# Patient Record
Sex: Male | Born: 1995 | Race: Black or African American | Hispanic: No | Marital: Single | Smoking: Never smoker
Health system: Southern US, Community
[De-identification: ages and names within clinical notes are randomized; demographics above are authoritative.]

---
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
## 2018-02-11 ENCOUNTER — Other Ambulatory Visit: Payer: Self-pay

## 2018-02-11 ENCOUNTER — Encounter (HOSPITAL_COMMUNITY): Payer: Self-pay | Admitting: Emergency Medicine

## 2018-02-11 ENCOUNTER — Emergency Department (HOSPITAL_COMMUNITY)
Admission: EM | Admit: 2018-02-11 | Discharge: 2018-02-11 | Disposition: A | Discharge status: Home / Self Care | Payer: Medicaid Other | Attending: Physician Assistant | Admitting: Physician Assistant

## 2018-02-11 DIAGNOSIS — R42 Dizziness and giddiness: Secondary | ICD-10-CM | POA: Diagnosis present

## 2018-02-11 DIAGNOSIS — G44209 Tension-type headache, unspecified, not intractable: Secondary | ICD-10-CM | POA: Diagnosis not present

## 2018-02-11 LAB — URINALYSIS, ROUTINE W REFLEX MICROSCOPIC
Bilirubin Urine: NEGATIVE
GLUCOSE, UA: NEGATIVE mg/dL
HGB URINE DIPSTICK: NEGATIVE
Ketones, ur: NEGATIVE mg/dL
Leukocytes, UA: NEGATIVE
Nitrite: NEGATIVE
PROTEIN: NEGATIVE mg/dL
Specific Gravity, Urine: 1.017 (ref 1.005–1.030)
pH: 6 (ref 5.0–8.0)

## 2018-02-11 LAB — BASIC METABOLIC PANEL
Anion gap: 8 (ref 5–15)
BUN: 20 mg/dL (ref 6–20)
CALCIUM: 9.4 mg/dL (ref 8.9–10.3)
CHLORIDE: 104 mmol/L (ref 101–111)
CO2: 23 mmol/L (ref 22–32)
CREATININE: 1.13 mg/dL (ref 0.61–1.24)
GFR calc non Af Amer: >60 mL/min (ref >60)
GLUCOSE: 85 mg/dL (ref 65–99)
Potassium: 3.9 mmol/L (ref 3.5–5.1)
Sodium: 135 mmol/L (ref 135–145)

## 2018-02-11 LAB — CBC
HCT: 42.7 % (ref 39.0–52.0)
Hemoglobin: 14.7 g/dL (ref 13.0–17.0)
MCH: 29.6 pg (ref 26.0–34.0)
MCHC: 34.4 g/dL (ref 30.0–36.0)
MCV: 85.9 fL (ref 78.0–100.0)
PLATELETS: 284 10*3/uL (ref 150–400)
RBC: 4.97 MIL/uL (ref 4.22–5.81)
RDW: 12.7 % (ref 11.5–15.5)
WBC: 3.9 10*3/uL — ABNORMAL LOW (ref 4.0–10.5)

## 2018-02-11 LAB — CBG MONITORING, ED: GLUCOSE-CAPILLARY: 82 mg/dL (ref 65–99)

## 2018-02-11 MED ORDER — KETOROLAC TROMETHAMINE 30 MG/ML IJ SOLN
30.0000 mg | Freq: Once | INTRAMUSCULAR | Status: AC
  Administered 2018-02-11: 30 mg via INTRAVENOUS
  Filled 2018-02-11: qty 1

## 2018-02-11 MED ORDER — ACETAMINOPHEN 325 MG PO TABS
650.0000 mg | ORAL_TABLET | Freq: Once | ORAL | Status: AC
  Administered 2018-02-11: 650 mg via ORAL
  Filled 2018-02-11: qty 2

## 2018-02-11 MED ORDER — SODIUM CHLORIDE 0.9 % IV BOLUS
1000.0000 mL | Freq: Once | INTRAVENOUS | Status: AC
  Administered 2018-02-11: 1000 mL via INTRAVENOUS

## 2018-02-11 NOTE — ED Triage Notes (Signed)
Pt complaint of lightheadedness, dry mouth, and tired onset yesterday.

## 2018-02-11 NOTE — ED Notes (Signed)
ED Provider at bedside. 

## 2018-02-11 NOTE — ED Provider Notes (Signed)
[facility]-EMERGENCY DEPT Provider Note   CSN: [id number] Arrival date & time: 02/11/18  1015     History   Chief Complaint Chief Complaint  Patient presents with  . Dizziness    HPI [facility] is a 22 y.o. male who presents to ED for evaluation of lightheadedness, dry mouth, polyuria, headache since yesterday.  Symptoms began when patient was at work.  He has not tried any medications prior to arrival to help with his symptoms.  He states that since arrival in the ED, his headache, lightheadedness has improved.  Denies any head injuries or falls, numbness in arms or legs, vision changes, vomiting, personal history of diabetes, neck pain, fevers, family history of aneurysms.  HPI  History reviewed. No pertinent past medical history.  There are no active problems to display for this patient.   History reviewed. No pertinent surgical history.      Home Medications    Prior to Admission medications   Not on File    Family History No family history on file.  Social History Social History   Tobacco Use  . Smoking status: Not on file  Substance Use Topics  . Alcohol use: Not on file  . Drug use: Not on file     Allergies   Patient has no known allergies.   Review of Systems Review of Systems  Constitutional: Negative for appetite change, chills and fever.  HENT: Negative for ear pain, rhinorrhea, sneezing and sore throat.   Eyes: Negative for photophobia and visual disturbance.  Respiratory: Negative for cough, chest tightness, shortness of breath and wheezing.   Cardiovascular: Negative for chest pain and palpitations.  Gastrointestinal: Negative for abdominal pain, blood in stool, constipation, diarrhea, nausea and vomiting.  Endocrine: Positive for polyuria.  Genitourinary: Negative for dysuria, hematuria and urgency.  Musculoskeletal: Negative for myalgias.  Skin: Negative for rash.  Neurological: Positive for  light-headedness and headaches. Negative for dizziness and weakness.     Physical Exam Updated Vital Signs BP (!) 141/89   Pulse (!) 55   Temp 98.1 F (36.7 C) (Oral)   Resp 13   Ht  (1.88 m)   Wt 77.1 kg (170 lb)   SpO2 100%   BMI 21.83 kg/m   Physical Exam  Constitutional: He is oriented to person, place, and time. He appears well-developed and well-nourished. No distress.  Nontoxic-appearing and in no acute distress.  HENT:  Head: Normocephalic and atraumatic.  Nose: Nose normal.  Eyes: Conjunctivae and EOM are normal. Left eye exhibits no discharge. No scleral icterus.  Neck: Normal range of motion. Neck supple.  No meningismus.  Cardiovascular: Normal rate, regular rhythm, normal heart sounds and intact distal pulses. Exam reveals no gallop and no friction rub.  No murmur heard. Pulmonary/Chest: Effort normal and breath sounds normal. No respiratory distress.  Abdominal: Soft. Bowel sounds are normal. He exhibits no distension. There is no tenderness. There is no guarding.  Musculoskeletal: Normal range of motion. He exhibits no edema.  Neurological: He is alert and oriented to person, place, and time. No cranial nerve deficit or sensory deficit. He exhibits normal muscle tone. Coordination normal.  Pupils reactive. No facial asymmetry noted. Cranial nerves appear grossly intact. Sensation intact to light touch on face, BUE and BLE. Strength 5/5 in BUE and BLE.  Skin: Skin is warm and dry. No rash noted.  Psychiatric: He has a normal mood and affect.  Nursing note and vitals reviewed.  ED Treatments / Results  Labs (all labs ordered are listed, but only abnormal results are displayed) Labs Reviewed  CBC - Abnormal; Notable for the following components:      Result Value   WBC 3.9 (*)    All other components within normal limits  URINALYSIS, ROUTINE W REFLEX MICROSCOPIC - Abnormal; Notable for the following components:   Color, Urine STRAW (*)    All other  components within normal limits  BASIC METABOLIC PANEL  CBG MONITORING, ED    EKG None  Radiology No results found.  Procedures Procedures (including critical care time)  Medications Ordered in ED Medications  sodium chloride 0.9 % bolus 1,000 mL (1,000 mLs Intravenous New Bag/Given 02/11/18 1240)  ketorolac (TORADOL) 30 MG/ML injection 30 mg (30 mg Intravenous Given 02/11/18 1240)  acetaminophen (TYLENOL) tablet 650 mg (650 mg Oral Given 02/11/18 1217)     Initial Impression / Assessment and Plan / ED Course  I have reviewed the triage vital signs and the nursing notes.  Pertinent labs & imaging results that were available during my care of the patient were reviewed by me and considered in my medical decision making (see chart for details).  Clinical Course as of Feb 11 1325  Wed Feb 11, 2018  1321 Patient reports improvement in symptoms with medication and fluids given.   [HK]    Clinical Course User Index [HK] Dietrich Pates, PA-C    Patient presents to ED for evaluation of lightheadedness, dry mouth, polyuria headaches since yesterday.  Symptoms began when the patient was at work.  Has not tried any medications prior to arrival to help with symptoms.  Since arrival in the ED, his headache and lightheadedness has improved.  Denies any head injuries or falls, numbness in arms or legs, fever, neck pain, vomiting, family history of aneurysms.  On physical exam patient is overall well-appearing.  He does not appear dehydrated.  No deficits on neurological exam noted.  No meningismus noted.  Lab work including CBG, CBC, BMP, urinalysis all unremarkable.  Reports improvement in his symptoms with fluids, Toradol and Tylenol given here in the ED.  Suspect tension type headache as a cause of his symptoms.  Will discharge home with continued anti-inflammatories or Tylenol and to follow-up with wellness [facility] for further evaluation. There are no headache characteristics that are lateralizing  or concerning for increased ICP, infectious or vascular cause of his symptoms.   Advised to return to ED for any severe worsening symptoms.  Portions of this note were generated with Scientist, clinical (histocompatibility and immunogenetics). Dictation errors may occur despite best attempts at proofreading.   Final Clinical Impressions(s) / ED Diagnoses   Final diagnoses:  Acute non intractable tension-type headache    ED Discharge Orders    None       Dietrich Pates, PA-C 02/11/18 1326    Mackuen, Cindee Salt, MD 02/11/18 1545

## 2018-08-06 ENCOUNTER — Encounter (HOSPITAL_COMMUNITY): Payer: Self-pay | Admitting: Emergency Medicine

## 2018-08-06 ENCOUNTER — Other Ambulatory Visit: Payer: Self-pay

## 2018-08-06 ENCOUNTER — Ambulatory Visit (HOSPITAL_COMMUNITY)
Admission: EM | Admit: 2018-08-06 | Discharge: 2018-08-06 | Disposition: A | Discharge status: Home / Self Care | Payer: Medicaid Other | Attending: Family Medicine | Admitting: Family Medicine

## 2018-08-06 DIAGNOSIS — J069 Acute upper respiratory infection, unspecified: Secondary | ICD-10-CM

## 2018-08-06 DIAGNOSIS — B9789 Other viral agents as the cause of diseases classified elsewhere: Secondary | ICD-10-CM

## 2018-08-06 MED ORDER — CETIRIZINE-PSEUDOEPHEDRINE ER 5-120 MG PO TB12: 1.0000 | ORAL_TABLET | Freq: Every day | ORAL | 0 refills | Status: AC

## 2018-08-06 MED ORDER — FLUTICASONE PROPIONATE 50 MCG/ACT NA SUSP: 2.0000 | Freq: Every day | NASAL | 0 refills | Status: AC

## 2018-08-06 NOTE — ED Triage Notes (Signed)
Pt cc headache , sore throat  and cough x 3 days.

## 2018-08-06 NOTE — Discharge Instructions (Signed)
Get plenty of rest and push fluids Zyrtec-D prescribed for nasal congestion, runny nose, and/or sore throat Flonase prescribed for nasal congestion and runny nose Use medications daily for symptom relief Use OTC medications like ibuprofen or tylenol as needed fever or pain Follow up with PCP or [facility] if symptoms persist Return or go to ER if you have any new or worsening symptoms fever, chills, nausea, vomiting, chest pain, cough, shortness of breath, wheezing, abdominal pain, changes in bowel or bladder habits, etc...Marland Kitchen

## 2018-08-06 NOTE — ED Provider Notes (Signed)
[facility]   [id number] 08/06/18 Arrival Time: 1705   CC: URI symptoms   SUBJECTIVE: History from: patient.  Keavon I Gladys DammeHailey is a 22 y.o. male who presents with abrupt onset of nasal congestion, rhinorrhea, PND, and dry cough x 2-3 days.  Denies positive sick exposure or precipitating event.  Has tried nyquil with relief.  Symptoms are made worse with lying down.  Reports previous symptoms in the past.   Denies fever, chills, fatigue, SOB, wheezing, chest pain, nausea, changes in bowel or bladder habits.    Denies tobacco use or vaping  ROS: As per HPI.  History reviewed. No pertinent past medical history. History reviewed. No pertinent surgical history. No Known Allergies No current facility-administered medications on file prior to encounter.    No current outpatient medications on file prior to encounter.   Social History   Socioeconomic History  . Marital status: Single    Spouse name: Not on file  . Number of children: Not on file  . Years of education: Not on file  . Highest education level: Not on file  Occupational History  . Not on file  Social Needs  . Financial resource strain: Not on file  . Food insecurity:    Worry: Not on file    Inability: Not on file  . Transportation needs:    Medical: Not on file    Non-medical: Not on file  Tobacco Use  . Smoking status: Never Smoker  Substance and Sexual Activity  . Alcohol use: Never    Frequency: Never  . Drug use: Never  . Sexual activity: Not on file  Lifestyle  . Physical activity:    Days per week: Not on file    Minutes per session: Not on file  . Stress: Not on file  Relationships  . Social connections:    Talks on phone: Not on file    Gets together: Not on file    Attends religious service: Not on file    Active member of club or organization: Not on file    Attends meetings of clubs or organizations: Not on file    Relationship status: Not on file  . Intimate partner  violence:    Fear of current or ex partner: Not on file    Emotionally abused: Not on file    Physically abused: Not on file    Forced sexual activity: Not on file  Other Topics Concern  . Not on file  Social History Narrative  . Not on file   Family History  Problem Relation Age of Onset  . Healthy Mother   . Healthy Father     OBJECTIVE:  Vitals:   08/06/18 1756 08/06/18 1759  BP:  (!) 142/107  Pulse:  81  Resp:  16  Temp:  98.1 F (36.7 C)  SpO2:  99%  Weight: 182 lb (82.6 kg)      General appearance: alert; appears mildly fatigue; speaking in full sentences and tolerating own secretions HEENT: NCAT; Ears: EACs clear, TMs pearly gray; Eyes: PERRL.  EOM grossly intact. Nose: nares patent with mild rhinorrhea and erythema, Throat: oropharynx clear, tonsils non erythematous or enlarged, uvula midline  Neck: supple without LAD Lungs: unlabored respirations, symmetrical air entry; cough: absent; no respiratory distress; CTAB Heart: regular rate and rhythm.  Radial pulses 2+ symmetrical bilaterally Skin: warm and dry Psychological: alert and cooperative; normal mood and affect  ASSESSMENT & PLAN:  1. Viral URI with cough  Meds ordered this encounter  Medications  . cetirizine-pseudoephedrine (ZYRTEC-D) 5-120 MG tablet    Sig: Take 1 tablet by mouth daily.    Dispense:  30 tablet    Refill:  0    Order Specific Question:   Supervising Provider    Answer:   Isa Rankin [id number]  . fluticasone (FLONASE) 50 MCG/ACT nasal spray    Sig: Place 2 sprays into both nostrils daily.    Dispense:  16 g    Refill:  0    Order Specific Question:   Supervising Provider    Answer:   Isa Rankin [[id number]]   Get plenty of rest and push fluids Zyrtec-D prescribed for nasal congestion, runny nose, and/or sore throat Flonase prescribed for nasal congestion and runny nose Use medications daily for symptom relief Use OTC medications like ibuprofen or tylenol as  needed fever or pain Follow up with PCP or [facility] if symptoms persist Return or go to ER if you have any new or worsening symptoms fever, chills, nausea, vomiting, chest pain, cough, shortness of breath, wheezing, abdominal pain, changes in bowel or bladder habits, etc...  Reviewed expectations re: course of current medical issues. Questions answered. Outlined signs and symptoms indicating need for more acute intervention. Patient verbalized understanding. After Visit Summary given.         Rennis Harding, PA-C 08/06/18 9562

## 2019-09-04 ENCOUNTER — Other Ambulatory Visit: Payer: Self-pay

## 2019-09-04 ENCOUNTER — Emergency Department (HOSPITAL_BASED_OUTPATIENT_CLINIC_OR_DEPARTMENT_OTHER)
Admission: EM | Admit: 2019-09-04 | Discharge: 2019-09-04 | Disposition: A | Discharge status: Home / Self Care | Payer: Medicaid Other | Attending: Emergency Medicine | Admitting: Emergency Medicine

## 2019-09-04 ENCOUNTER — Encounter (HOSPITAL_BASED_OUTPATIENT_CLINIC_OR_DEPARTMENT_OTHER): Payer: Self-pay | Admitting: *Deleted

## 2019-09-04 DIAGNOSIS — S80872A Other superficial bite, left lower leg, initial encounter: Secondary | ICD-10-CM | POA: Diagnosis not present

## 2019-09-04 DIAGNOSIS — Y999 Unspecified external cause status: Secondary | ICD-10-CM | POA: Diagnosis not present

## 2019-09-04 DIAGNOSIS — Y929 Unspecified place or not applicable: Secondary | ICD-10-CM | POA: Diagnosis not present

## 2019-09-04 DIAGNOSIS — S50812A Abrasion of left forearm, initial encounter: Secondary | ICD-10-CM | POA: Diagnosis not present

## 2019-09-04 DIAGNOSIS — S70371A Other superficial bite of right thigh, initial encounter: Secondary | ICD-10-CM | POA: Insufficient documentation

## 2019-09-04 DIAGNOSIS — S60512A Abrasion of left hand, initial encounter: Secondary | ICD-10-CM | POA: Insufficient documentation

## 2019-09-04 DIAGNOSIS — Y939 Activity, unspecified: Secondary | ICD-10-CM | POA: Diagnosis not present

## 2019-09-04 DIAGNOSIS — Z23 Encounter for immunization: Secondary | ICD-10-CM | POA: Diagnosis not present

## 2019-09-04 DIAGNOSIS — W540XXA Bitten by dog, initial encounter: Secondary | ICD-10-CM | POA: Insufficient documentation

## 2019-09-04 MED ORDER — AMOXICILLIN-POT CLAVULANATE 875-125 MG PO TABS
1.0000 | ORAL_TABLET | Freq: Once | ORAL | Status: AC
  Administered 2019-09-04: 1 via ORAL
  Filled 2019-09-04: qty 1

## 2019-09-04 MED ORDER — IBUPROFEN 800 MG PO TABS
800.0000 mg | ORAL_TABLET | Freq: Once | ORAL | Status: AC
  Administered 2019-09-04: 800 mg via ORAL
  Filled 2019-09-04: qty 1

## 2019-09-04 MED ORDER — AMOXICILLIN-POT CLAVULANATE 875-125 MG PO TABS: 1.0000 | ORAL_TABLET | Freq: Two times a day (BID) | ORAL | 0 refills | Status: DC

## 2019-09-04 MED ORDER — TETANUS-DIPHTH-ACELL PERTUSSIS 5-2.5-18.5 LF-MCG/0.5 IM SUSP
0.5000 mL | Freq: Once | INTRAMUSCULAR | Status: AC
  Administered 2019-09-04: 0.5 mL via INTRAMUSCULAR
  Filled 2019-09-04: qty 0.5

## 2019-09-04 MED ORDER — NAPROXEN 500 MG PO TABS: 500.0000 mg | ORAL_TABLET | Freq: Two times a day (BID) | ORAL | 0 refills | Status: AC

## 2019-09-04 NOTE — ED Triage Notes (Addendum)
Pt reports he was bitten by a friend's dog today. Bite marks to right thigh, left lower leg, and left arm. Pt has already made a report with the police. Dog is UTD on rabies vaccine per pt

## 2019-09-04 NOTE — Discharge Instructions (Signed)
You are seen today for dog bite.  It is very important that you keep these wounds clean and dry as these can get easily infected.  We have sent a prescription for an antibiotic that you should start taking right away.  If you have any new or worsening symptoms please return to the emergency department.  We have updated your tetanus shot today.  Please apply ice to the swollen areas and take naproxen as needed for pain. Thank you for allowing me to care for you today. Please return to the emergency department if you have new or worsening symptoms. Take your medications as instructed.

## 2019-09-04 NOTE — ED Provider Notes (Signed)
[facility] Provider Note   CSN: [id number] Arrival date & time: 09/04/19  1327     History Chief Complaint  Patient presents with  . Animal Bite    [facility]avez is a 23 y.o. male.  Patient is a 23 year old male with no past medical history presenting to the emergency department for dog bite.  Patient reports that he just brought home a new 43-month-old death pitbull puppy.  Reports that the dog is up-to-date on all of his shots.  Reports that the dog got scared and began to attack him.  He has abrasions on his left forearm, left hand, left lower leg and right thigh.  Unknown last tetanus shot.  This happened about 1 hour prior to arrival.        History reviewed. No pertinent past medical history.  There are no problems to display for this patient.   History reviewed. No pertinent surgical history.     Family History  Problem Relation Age of Onset  . Healthy Mother   . Healthy Father     Social History   Tobacco Use  . Smoking status: Never Smoker  . Smokeless tobacco: Never Used  Substance Use Topics  . Alcohol use: Never  . Drug use: Never    Home Medications Prior to Admission medications   Medication Sig Start Date End Date Taking? Authorizing Provider  amoxicillin-clavulanate (AUGMENTIN) 875-125 MG tablet Take 1 tablet by mouth every 12 (twelve) hours. 09/04/19   Alveria Apley, PA-C  cetirizine-pseudoephedrine (ZYRTEC-D) 5-120 MG tablet Take 1 tablet by mouth daily. 08/06/18   Wurst, [place], PA-C  fluticasone (FLONASE) 50 MCG/ACT nasal spray Place 2 sprays into both nostrils daily. 08/06/18   Wurst, [place], PA-C  naproxen (NAPROSYN) 500 MG tablet Take 1 tablet (500 mg total) by mouth 2 (two) times daily. 09/04/19   Alveria Apley, PA-C    Allergies    Patient has no known allergies.  Review of Systems   Review of Systems  Constitutional: Negative for fever.  Skin: Positive for wound.    Allergic/Immunologic: Negative for immunocompromised state.  Hematological: Does not bruise/bleed easily.  All other systems reviewed and are negative.   Physical Exam Updated Vital Signs BP (!) 142/96 (BP Location: Left Arm)   Pulse 86   Temp 99.6 F (37.6 C) (Oral)   Resp 16   Ht 5\' 10"  (1.778 m)   Wt 88.5 kg   SpO2 100%   BMI 27.98 kg/m   Physical Exam Vitals and nursing note reviewed.  Constitutional:      Appearance: Normal appearance.  HENT:     Head: Normocephalic.  Eyes:     Conjunctiva/sclera: Conjunctivae normal.  Pulmonary:     Effort: Pulmonary effort is normal.  Musculoskeletal:        General: Swelling and tenderness present. No deformity.     Right forearm: No deformity or bony tenderness.     Left forearm: No deformity or bony tenderness.     Right hand: No deformity or bony tenderness. Normal capillary refill. Normal pulse.     Left hand: No deformity or bony tenderness. Normal capillary refill. Normal pulse.       Arms:       Legs:     Comments: Superficial abrasions to the left forearm and left hand.  There are shallow puncture wounds to the left lower leg and right upper thigh.  Mild swelling.  No bleeding.  No palpable foreign body  such as dog teeth.  No area amenable to suturing.  Skin:    General: Skin is dry.     Capillary Refill: Capillary refill takes less than 2 seconds.     Findings: No bruising.  Neurological:     Mental Status: He is alert.  Psychiatric:        Mood and Affect: Mood normal.     ED Results / Procedures / Treatments   Labs (all labs ordered are listed, but only abnormal results are displayed) Labs Reviewed - No data to display  EKG None  Radiology No results found.  Procedures Procedures (including critical care time)  Medications Ordered in ED Medications  Tdap (BOOSTRIX) injection 0.5 mL (0.5 mLs Intramuscular Given 09/04/19 1441)  ibuprofen (ADVIL) tablet 800 mg (800 mg Oral Given 09/04/19 1442)   amoxicillin-clavulanate (AUGMENTIN) 875-125 MG per tablet 1 tablet (1 tablet Oral Given 09/04/19 1442)    ED Course  I have reviewed the triage vital signs and the nursing notes.  Pertinent labs & imaging results that were available during my care of the patient were reviewed by me and considered in my medical decision making (see chart for details).  Clinical Course as of Sep 03 1450  Sat Sep 04, 2019  1422 Patient presenting with multiple abrasions and shallow puncture wounds from a puppy bite just prior to arrival.  Dog is up-to-date on rabies vaccine.  Patient has unknown tetanus so tetanus shot was ordered.  Patient was given first dose of Augmentin here.  Wounds were cleaned extensively.  There is no area amenable to suturing and no area with palpable foreign bodies.  Patient advised on good wound care and strict return precautions.   [KM]    Clinical Course User Index [KM] Jeral Pinch   MDM Rules/Calculators/A&P     CHA2DS2/VAS Stroke Risk Points      N/A >= 2 Points: High Risk  1 - 1.99 Points: Medium Risk  0 Points: Low Risk    A final score could not be computed because of missing components.: Last  Change: N/A     This score determines the patient's risk of having a stroke if the  patient has atrial fibrillation.      This score is not applicable to this patient. Components are not  calculated.                   Based on review of vitals, medical screening exam, lab work and/or imaging, there does not appear to be an acute, emergent etiology for the patient's symptoms. Counseled pt on good return precautions and encouraged both PCP and ED follow-up as needed.  Prior to discharge, I also discussed incidental imaging findings with patient in detail and advised appropriate, recommended follow-up in detail.  Clinical Impression: 1. Dog bite, initial encounter     Disposition: Discharge  Prior to providing a prescription for a controlled substance, I  independently reviewed the patient's recent prescription history on the [place] Controlled Substance Reporting System. The patient had no recent or regular prescriptions and was deemed appropriate for a brief, less than 3 day prescription of narcotic for acute analgesia.  This note was prepared with assistance of Conservation officer, historic buildings. Occasional wrong-word or sound-a-like substitutions may have occurred due to the inherent limitations of voice recognition software.  Final Clinical Impression(s) / ED Diagnoses Final diagnoses:  Dog bite, initial encounter    Rx / DC Orders ED Discharge Orders  Ordered    amoxicillin-clavulanate (AUGMENTIN) 875-125 MG tablet  Every 12 hours     09/04/19 1424    naproxen (NAPROSYN) 500 MG tablet  2 times daily     09/04/19 1424           Jeral PinchMcLean, Reiana Poteet A, PA-C 09/04/19 1451    Benjiman CorePickering, Nathan, MD 09/04/19 1520

## 2019-09-07 ENCOUNTER — Other Ambulatory Visit: Payer: Self-pay

## 2019-09-07 ENCOUNTER — Emergency Department (HOSPITAL_BASED_OUTPATIENT_CLINIC_OR_DEPARTMENT_OTHER): Payer: Medicaid Other

## 2019-09-07 ENCOUNTER — Emergency Department (HOSPITAL_BASED_OUTPATIENT_CLINIC_OR_DEPARTMENT_OTHER)
Admission: EM | Admit: 2019-09-07 | Discharge: 2019-09-07 | Disposition: A | Discharge status: Home / Self Care | Payer: Medicaid Other | Attending: Emergency Medicine | Admitting: Emergency Medicine

## 2019-09-07 ENCOUNTER — Encounter (HOSPITAL_BASED_OUTPATIENT_CLINIC_OR_DEPARTMENT_OTHER): Payer: Self-pay | Admitting: *Deleted

## 2019-09-07 DIAGNOSIS — R070 Pain in throat: Secondary | ICD-10-CM | POA: Diagnosis not present

## 2019-09-07 DIAGNOSIS — R0989 Other specified symptoms and signs involving the circulatory and respiratory systems: Secondary | ICD-10-CM | POA: Diagnosis not present

## 2019-09-07 DIAGNOSIS — R0602 Shortness of breath: Secondary | ICD-10-CM | POA: Diagnosis present

## 2019-09-07 DIAGNOSIS — Z20828 Contact with and (suspected) exposure to other viral communicable diseases: Secondary | ICD-10-CM | POA: Diagnosis not present

## 2019-09-07 DIAGNOSIS — Z79899 Other long term (current) drug therapy: Secondary | ICD-10-CM | POA: Diagnosis not present

## 2019-09-07 DIAGNOSIS — Z20822 Contact with and (suspected) exposure to covid-19: Secondary | ICD-10-CM

## 2019-09-07 DIAGNOSIS — R438 Other disturbances of smell and taste: Secondary | ICD-10-CM | POA: Insufficient documentation

## 2019-09-07 NOTE — ED Triage Notes (Signed)
Sob x 3 days after starting new medication after dog bite. He is also having abdominal pain that started about the same time.

## 2019-09-07 NOTE — Discharge Instructions (Addendum)
You were seen today for feeling unwell. Your chest xray was normal. Your symptoms may be consistent with COVID-19.  It is important that you go home and quarantine yourself until you know your results.  You may stop taking naproxen as this may be upsetting your stomach.  Please continue to take the Augmentin to prevent infection from your dog bite.  Drink lots of water to stay hydrated.  You may take Tylenol or Motrin for body aches and fever. Thank you for allowing me to care for you today. Please return to the emergency department if you have new or worsening symptoms. Take your medications as instructed.

## 2019-09-07 NOTE — ED Provider Notes (Signed)
[facility] EMERGENCY DEPARTMENT Provider Note   CSN: [id number] Arrival date & time: 09/07/19  1301     History Chief Complaint  Patient presents with  . Abdominal Pain  . Shortness of Breath    Edwin I Gladys DammeHailey is a 23 y.o. male.  Patient is a 23 year old male with no significant past medical history presenting to the emergency department for shortness of breath and stomach upset.  Patient reports that yesterday he began to have "gurgling" in his stomach as well as feeling short of breath when he is speaking long sentences.  He also endorses runny nose, sore throat and loss of taste.  No known exposure to COVID-19.  Reports that he started taking naproxen and Augmentin on the 12th of this month after sustaining a dog bite and he is unsure if the side effects might be coming from this medicine.  Denies any fever, chills, nausea, vomiting, chest pain, dysuria.  He has not tried anything for relief.  Reports the dog bites are healing well        History reviewed. No pertinent past medical history.  There are no problems to display for this patient.   History reviewed. No pertinent surgical history.     Family History  Problem Relation Age of Onset  . Healthy Mother   . Healthy Father     Social History   Tobacco Use  . Smoking status: Never Smoker  . Smokeless tobacco: Never Used  Substance Use Topics  . Alcohol use: Never  . Drug use: Never    Home Medications Prior to Admission medications   Medication Sig Start Date End Date Taking? Authorizing Provider  amoxicillin-clavulanate (AUGMENTIN) 875-125 MG tablet Take 1 tablet by mouth every 12 (twelve) hours. 09/04/19   Arlyn DunningMcLean, Terryn Rosenkranz A, PA-C  cetirizine-pseudoephedrine (ZYRTEC-D) 5-120 MG tablet Take 1 tablet by mouth daily. 08/06/18   Wurst, [place], PA-C  fluticasone (FLONASE) 50 MCG/ACT nasal spray Place 2 sprays into both nostrils daily. 08/06/18   Wurst, [place], PA-C  naproxen (NAPROSYN) 500  MG tablet Take 1 tablet (500 mg total) by mouth 2 (two) times daily. 09/04/19   Arlyn DunningMcLean, Presley Summerlin A, PA-C    Allergies    Patient has no known allergies.  Review of Systems   Review of Systems  Constitutional: Negative for appetite change, chills, diaphoresis and fever.  HENT: Positive for rhinorrhea and sore throat. Negative for congestion.   Respiratory: Positive for shortness of breath. Negative for cough, chest tightness and wheezing.   Cardiovascular: Negative for chest pain, palpitations and leg swelling.  Gastrointestinal: Positive for abdominal distention. Negative for abdominal pain, constipation, diarrhea, nausea and vomiting.  Genitourinary: Negative for dysuria.  Musculoskeletal: Negative for arthralgias, back pain and myalgias.  Skin: Negative for rash.  Neurological: Negative for dizziness, syncope and light-headedness.    Physical Exam Updated Vital Signs BP (!) 156/91   Pulse 80   Temp 98.2 F (36.8 C) (Oral)   Resp 20   Ht 5\' 10"  (1.778 m)   Wt 88.5 kg   SpO2 100%   BMI 27.98 kg/m   Physical Exam Vitals and nursing note reviewed.  Constitutional:      General: He is not in acute distress.    Appearance: Normal appearance. He is well-developed and normal weight. He is not ill-appearing, toxic-appearing or diaphoretic.  HENT:     Head: Normocephalic.     Mouth/Throat:     Mouth: Mucous membranes are moist.     Pharynx: Oropharynx  is clear. No pharyngeal swelling or oropharyngeal exudate.  Eyes:     Conjunctiva/sclera: Conjunctivae normal.  Cardiovascular:     Rate and Rhythm: Normal rate and regular rhythm.  Pulmonary:     Effort: Pulmonary effort is normal. No respiratory distress.     Breath sounds: Normal breath sounds. No wheezing, rhonchi or rales.  Chest:     Chest wall: No tenderness.  Abdominal:     General: Abdomen is flat. Bowel sounds are normal.     Palpations: Abdomen is soft.     Tenderness: There is no abdominal tenderness.  Skin:     General: Skin is warm and dry.     Comments: Dog bite wounds clinically appear improved from the last time I saw him on the 12th.  No surrounding erythema, drainage or significant tenderness to palpation  Neurological:     Mental Status: He is alert.  Psychiatric:        Mood and Affect: Mood normal.     ED Results / Procedures / Treatments   Labs (all labs ordered are listed, but only abnormal results are displayed) Labs Reviewed  NOVEL CORONAVIRUS, NAA (HOSP ORDER, SEND-OUT TO REF LAB; TAT 18-24 HRS)    EKG None  Radiology DG Chest Portable 1 View  Result Date: 09/07/2019 CLINICAL DATA:  Shortness of breath EXAM: PORTABLE CHEST 1 VIEW COMPARISON:  None. FINDINGS: The heart size and mediastinal contours are within normal limits. Both lungs are clear. The visualized skeletal structures are unremarkable. IMPRESSION: No active disease. Electronically Signed   By: Prudencio Pair M.D.   On: 09/07/2019 14:40    Procedures Procedures (including critical care time)  Medications Ordered in ED Medications - No data to display  ED Course  I have reviewed the triage vital signs and the nursing notes.  Pertinent labs & imaging results that were available during my care of the patient were reviewed by me and considered in my medical decision making (see chart for details).  Clinical Course as of Sep 06 1445  Tue Sep 07, 2019  1340 Patient presenting with constellation of symptoms consistent with possible coronavirus since yesterday.  Patient currently taking Augmentin and naproxen for dog bite.  Given his stomach upset I told him to stop taking the naproxen if he is not having significant pain.  Continue Augmentin.  I also reviewed his dog bites today which appeared to be healing well.  He clinically appears well and is afebrile and satting 100% on room air.  Will obtain a chest x-ray and a Covid test.  Advised on strict return precautions.   [KM]    Clinical Course User Index [KM]  Kristine Royal   MDM Rules/Calculators/A&P                      Based on review of vitals, medical screening exam, lab work and/or imaging, there does not appear to be an acute, emergent etiology for the patient's symptoms. Counseled pt on good return precautions and encouraged both PCP and ED follow-up as needed.  Prior to discharge, I also discussed incidental imaging findings with patient in detail and advised appropriate, recommended follow-up in detail.  Clinical Impression: 1. Suspected COVID-19 virus infection     Disposition: Discharge  Prior to providing a prescription for a controlled substance, I independently reviewed the patient's recent prescription history on the [place]. The patient had no recent or regular prescriptions and was deemed  appropriate for a brief, less than 3 day prescription of narcotic for acute analgesia.  This note was prepared with assistance of Conservation officer, historic buildings. Occasional wrong-word or sound-a-like substitutions may have occurred due to the inherent limitations of voice recognition software.  Final Clinical Impression(s) / ED Diagnoses Final diagnoses:  Suspected COVID-19 virus infection    Rx / DC Orders ED Discharge Orders    None       Arlyn Dunning, PA-C 09/07/19 1446    Little, Ambrose Finland, MD 09/08/19 [phone]

## 2019-09-08 LAB — NOVEL CORONAVIRUS, NAA (HOSP ORDER, SEND-OUT TO REF LAB; TAT 18-24 HRS): SARS-CoV-2, NAA: NOT DETECTED

## 2019-09-13 ENCOUNTER — Telehealth: Payer: Self-pay

## 2019-09-13 NOTE — Telephone Encounter (Signed)
Caller advised of negative result

## 2019-10-18 ENCOUNTER — Other Ambulatory Visit: Payer: Self-pay

## 2019-10-18 ENCOUNTER — Encounter (HOSPITAL_COMMUNITY): Payer: Self-pay

## 2019-10-18 ENCOUNTER — Ambulatory Visit (HOSPITAL_COMMUNITY)
Admission: EM | Admit: 2019-10-18 | Discharge: 2019-10-18 | Disposition: A | Discharge status: Home / Self Care | Payer: Medicaid Other

## 2019-10-18 DIAGNOSIS — S93402D Sprain of unspecified ligament of left ankle, subsequent encounter: Secondary | ICD-10-CM | POA: Diagnosis not present

## 2019-10-18 NOTE — ED Triage Notes (Addendum)
Pt states last Monday he sprung his left ankle at work, he went to the urgent care in [place] where he was seen Monday & states he is here for a follow up to have his  restrictions lifted.

## 2019-10-18 NOTE — ED Provider Notes (Signed)
[facility]    CSN: [id number] Arrival date & time: 10/18/19  0901      History   Chief Complaint Chief Complaint  Patient presents with  . Ankle Injury    HPI Edwin Chavez is a 24 y.o. male.   Patient reports to urgent care today for evaluation of a previous ankle injury. He was seen at an outside urgent care on Monday 10/11/2019 after spraining his ankle at work. He was diagnosed with an ankle sprain and put on restricted duty at work. He reports because of the restrictions and the temp nature of his work, he was unable to actually work. He reports improvement in pain and ambulation and believes he is able to return to work at this time. Pain is mild at worst. He has been taking the voltaren as prescribed and using the ankle brace given as much as possible.      History reviewed. No pertinent past medical history.  There are no problems to display for this patient.   History reviewed. No pertinent surgical history.     Home Medications    Prior to Admission medications   Medication Sig Start Date End Date Taking? Authorizing Provider  amoxicillin-clavulanate (AUGMENTIN) 875-125 MG tablet Take 1 tablet by mouth every 12 (twelve) hours. 09/04/19   Arlyn Dunning, PA-C  cetirizine-pseudoephedrine (ZYRTEC-D) 5-120 MG tablet Take 1 tablet by mouth daily. 08/06/18   Wurst, [place], PA-C  diclofenac (VOLTAREN) 75 MG EC tablet Take 75 mg by mouth 2 (two) times daily. 10/11/19   [provider]  fluticasone (FLONASE) 50 MCG/ACT nasal spray Place 2 sprays into both nostrils daily. 08/06/18   Wurst, [place], PA-C  naproxen (NAPROSYN) 500 MG tablet Take 1 tablet (500 mg total) by mouth 2 (two) times daily. 09/04/19   Arlyn Dunning, PA-C    Family History Family History  Problem Relation Age of Onset  . Healthy Mother   . Healthy Father     Social History Social History   Tobacco Use  . Smoking status: Never Smoker  . Smokeless tobacco:  Never Used  Substance Use Topics  . Alcohol use: Never  . Drug use: Never     Allergies   Patient has no known allergies.   Review of Systems Review of Systems  Constitutional: Negative for chills and fever.  Respiratory: Negative for cough.   Cardiovascular: Negative for chest pain.  Gastrointestinal: Negative for abdominal pain.  Musculoskeletal: Positive for arthralgias. Negative for back pain, gait problem and myalgias.  Skin: Negative for color change and rash.  Neurological: Negative for weakness.  All other systems reviewed and are negative.    Physical Exam Triage Vital Signs ED Triage Vitals  Enc Vitals Group     BP 10/18/19 0918 125/80     Pulse Rate 10/18/19 0918 79     Resp 10/18/19 0918 17     Temp 10/18/19 0918 99.1 F (37.3 C)     Temp Source 10/18/19 0918 Oral     SpO2 10/18/19 0918 97 %     Weight 10/18/19 0915 184 lb 6.4 oz (83.6 kg)     Height --      Head Circumference --      Peak Flow --      Pain Score 10/18/19 0915 0     Pain Loc --      Pain Edu? --      Excl. in GC? --    No data found.  Updated Vital Signs BP 125/80 (BP Location: Right Arm)   Pulse 79   Temp 99.1 F (37.3 C) (Oral)   Resp 17   Wt 184 lb 6.4 oz (83.6 kg)   SpO2 97%   BMI 26.46 kg/m   Visual Acuity Right Eye Distance:   Left Eye Distance:   Bilateral Distance:    Right Eye Near:   Left Eye Near:    Bilateral Near:     Physical Exam Vitals and nursing note reviewed.  Constitutional:      General: He is not in acute distress.    Appearance: Normal appearance. He is well-developed and normal weight.  HENT:     Head: Normocephalic and atraumatic.  Eyes:     Conjunctiva/sclera: Conjunctivae normal.  Cardiovascular:     Rate and Rhythm: Normal rate.  Pulmonary:     Effort: Pulmonary effort is normal. No respiratory distress.  Musculoskeletal:        General: Tenderness (mild ttp over ATFL and deltoid of left ankle. ) present.     Cervical back: Neck  supple.     Right lower leg: No edema.     Left lower leg: No edema.     Comments: Left ankle is stable. No effusion.   Able to ambulate well. Able to perform calf raise bilaterally.   Skin:    General: Skin is warm and dry.     Findings: No bruising.  Neurological:     General: No focal deficit present.     Mental Status: He is alert and oriented to person, place, and time.     Motor: No weakness.     Gait: Gait normal.  Psychiatric:        Mood and Affect: Mood normal.        Behavior: Behavior normal.        Thought Content: Thought content normal.        Judgment: Judgment normal.      UC Treatments / Results  Labs (all labs ordered are listed, but only abnormal results are displayed) Labs Reviewed - No data to display  EKG   Radiology No results found.  Procedures Procedures (including critical care time)  Medications Ordered in UC Medications - No data to display  Initial Impression / Assessment and Plan / UC Course  I have reviewed the triage vital signs and the nursing notes.  Pertinent labs & imaging results that were available during my care of the patient were reviewed by me and considered in my medical decision making (see chart for details).     #Left ankle sprain - Fully duty return-  recovering well. Ambulating without issue. Discussed ace wrap if he desires. Recommended utilizing brace he was given if possible. Voltaren as needed as this point.  - discussed follow up if having a delay in progress, otherwise he should be fine. Sportsmed exercises given.   Final Clinical Impressions(s) / UC Diagnoses   Final diagnoses:  Sprain of left ankle, unspecified ligament, subsequent encounter     Discharge Instructions     You may return to work. If you notice worsening pain or a delay in progress please follow up with your primary care or this clinic.   Continue the voltaren as needed  Ice the ankle at the end of your work days and perform the  attached exercises      ED Prescriptions    None     PDMP not reviewed this encounter.   Danthony Kendrix,  Marguerita Beards, PA-C 10/18/19 1031

## 2019-10-18 NOTE — Discharge Instructions (Signed)
You may return to work. If you notice worsening pain or a delay in progress please follow up with your primary care or this clinic.   Continue the voltaren as needed  Ice the ankle at the end of your work days and perform the attached exercises

## 2020-01-05 ENCOUNTER — Other Ambulatory Visit: Payer: Self-pay

## 2020-01-05 ENCOUNTER — Ambulatory Visit (HOSPITAL_COMMUNITY)
Admission: EM | Admit: 2020-01-05 | Discharge: 2020-01-05 | Disposition: A | Discharge status: Home / Self Care | Payer: Medicaid Other | Attending: Urgent Care | Admitting: Urgent Care

## 2020-01-05 ENCOUNTER — Encounter (HOSPITAL_COMMUNITY): Payer: Self-pay | Admitting: Emergency Medicine

## 2020-01-05 DIAGNOSIS — M25532 Pain in left wrist: Secondary | ICD-10-CM | POA: Diagnosis not present

## 2020-01-05 DIAGNOSIS — M79632 Pain in left forearm: Secondary | ICD-10-CM | POA: Diagnosis not present

## 2020-01-05 DIAGNOSIS — M79602 Pain in left arm: Secondary | ICD-10-CM | POA: Diagnosis not present

## 2020-01-05 DIAGNOSIS — S56912A Strain of unspecified muscles, fascia and tendons at forearm level, left arm, initial encounter: Secondary | ICD-10-CM

## 2020-01-05 DIAGNOSIS — S66912A Strain of unspecified muscle, fascia and tendon at wrist and hand level, left hand, initial encounter: Secondary | ICD-10-CM

## 2020-01-05 MED ORDER — MELOXICAM 15 MG PO TABS: 15.0000 mg | ORAL_TABLET | Freq: Every day | ORAL | 0 refills | Status: AC

## 2020-01-05 MED ORDER — TIZANIDINE HCL 4 MG PO TABS: 4.0000 mg | ORAL_TABLET | Freq: Every evening | ORAL | 0 refills | Status: AC | PRN

## 2020-01-05 MED ORDER — MELOXICAM 15 MG PO TABS: 15.0000 mg | ORAL_TABLET | Freq: Every day | ORAL | 0 refills | Status: DC

## 2020-01-05 MED ORDER — TIZANIDINE HCL 4 MG PO TABS: 4.0000 mg | ORAL_TABLET | Freq: Every evening | ORAL | 0 refills | Status: DC | PRN

## 2020-01-05 NOTE — ED Provider Notes (Signed)
[facility]   MRN: [id number] DOB: [birth year]  Subjective:   Edwin Chavez is a 24 y.o. male presenting for 2-day history of moderate to severe left-sided arm pain worst over his forearm and wrist.  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  Job is very fast-paced and requires repetitive motions, weight is varied but definitely lifts heavy items intermittently.  Denies fall, trauma, bruising, redness, warmth.  Has not taken any medications for relief.  Denies taking chronic medications.    No Known Allergies  History reviewed. No pertinent past medical history.   History reviewed. No pertinent surgical history.  Family History  Problem Relation Age of Onset  . Healthy Mother   . Healthy Father     Social History   Tobacco Use  . Smoking status: Never Smoker  . Smokeless tobacco: Never Used  Substance Use Topics  . Alcohol use: Never  . Drug use: Never    ROS   Objective:   Vitals: BP (!) 153/81   Pulse 81   Temp 98.3 F (36.8 C) (Oral)   Resp 16   SpO2 98%   Physical Exam Constitutional:      General: He is not in acute distress.    Appearance: Normal appearance. He is well-developed and normal weight. He is not ill-appearing, toxic-appearing or diaphoretic.  HENT:     Head: Normocephalic and atraumatic.     Right Ear: External ear normal.     Left Ear: External ear normal.     Nose: Nose normal.     Mouth/Throat:     Pharynx: Oropharynx is clear.  Eyes:     General: No scleral icterus.       Right eye: No discharge.        Left eye: No discharge.     Extraocular Movements: Extraocular movements intact.     Pupils: Pupils are equal, round, and reactive to light.  Cardiovascular:     Rate and Rhythm: Normal rate.  Pulmonary:     Effort: Pulmonary effort is normal.  Musculoskeletal:     Left shoulder: No swelling, deformity, effusion, laceration, tenderness, bony tenderness or crepitus.  Normal range of motion. Normal strength.     Left upper arm: Tenderness (mild over lateral arm) present. No swelling, edema, deformity, lacerations or bony tenderness.     Left elbow: No swelling, deformity, effusion or lacerations. Normal range of motion. No tenderness.     Left forearm: Tenderness present. No swelling, edema, deformity, lacerations or bony tenderness.     Left wrist: Tenderness present. No swelling, deformity, effusion, lacerations, snuff box tenderness or crepitus. Decreased range of motion.     Left hand: No swelling, deformity, lacerations, tenderness or bony tenderness. Normal range of motion. Normal strength. Normal sensation. Normal capillary refill.     Cervical back: Normal range of motion. No swelling, edema, deformity, erythema, signs of trauma, lacerations, rigidity, spasms, torticollis, tenderness, bony tenderness or crepitus. No pain with movement. Normal range of motion.     Comments: Negative Spurling and Lhermitte sign. No warmth of erythema of left arm. No ecchymosis.  Skin:    General: Skin is warm and dry.  Neurological:     Mental Status: He is alert and oriented to person, place, and time.  Psychiatric:        Mood and Affect: Mood normal.        Behavior: Behavior normal.  Thought Content: Thought content normal.        Judgment: Judgment normal.     Assessment and Plan :   1. Left forearm pain   2. Muscle strain of left forearm, initial encounter   3. Left wrist pain   4. Left arm pain   5. Wrist strain, left, initial encounter     Will manage for muscle strain related to the nature of his work. Note for 2 days off provided. Counseled patient on potential for adverse effects with medications prescribed/recommended today, ER and return-to-clinic precautions discussed, patient verbalized understanding.    Jaynee Eagles, PA-C 01/05/20 1314

## 2020-01-05 NOTE — Discharge Instructions (Signed)
Try to drink 80-96 ounces of water daily especially on your work days. Use meloxicam once a day for pain and inflammation associated with your muscle strain. The muscle relaxant can be used at bedtime.

## 2020-01-05 NOTE — ED Triage Notes (Signed)
He started a new position Monday and had to left heavy objects. Left arm pain, numbness, and swelling. Pain throughout left arm, but worst at wrist.

## 2020-12-24 IMAGING — DX DG CHEST 1V PORT
1 series · 1 of 1 positions shown · non-contrast
Comparison: None.

CLINICAL DATA: Shortness of breath

EXAM:
PORTABLE CHEST 1 VIEW

[chest ap]
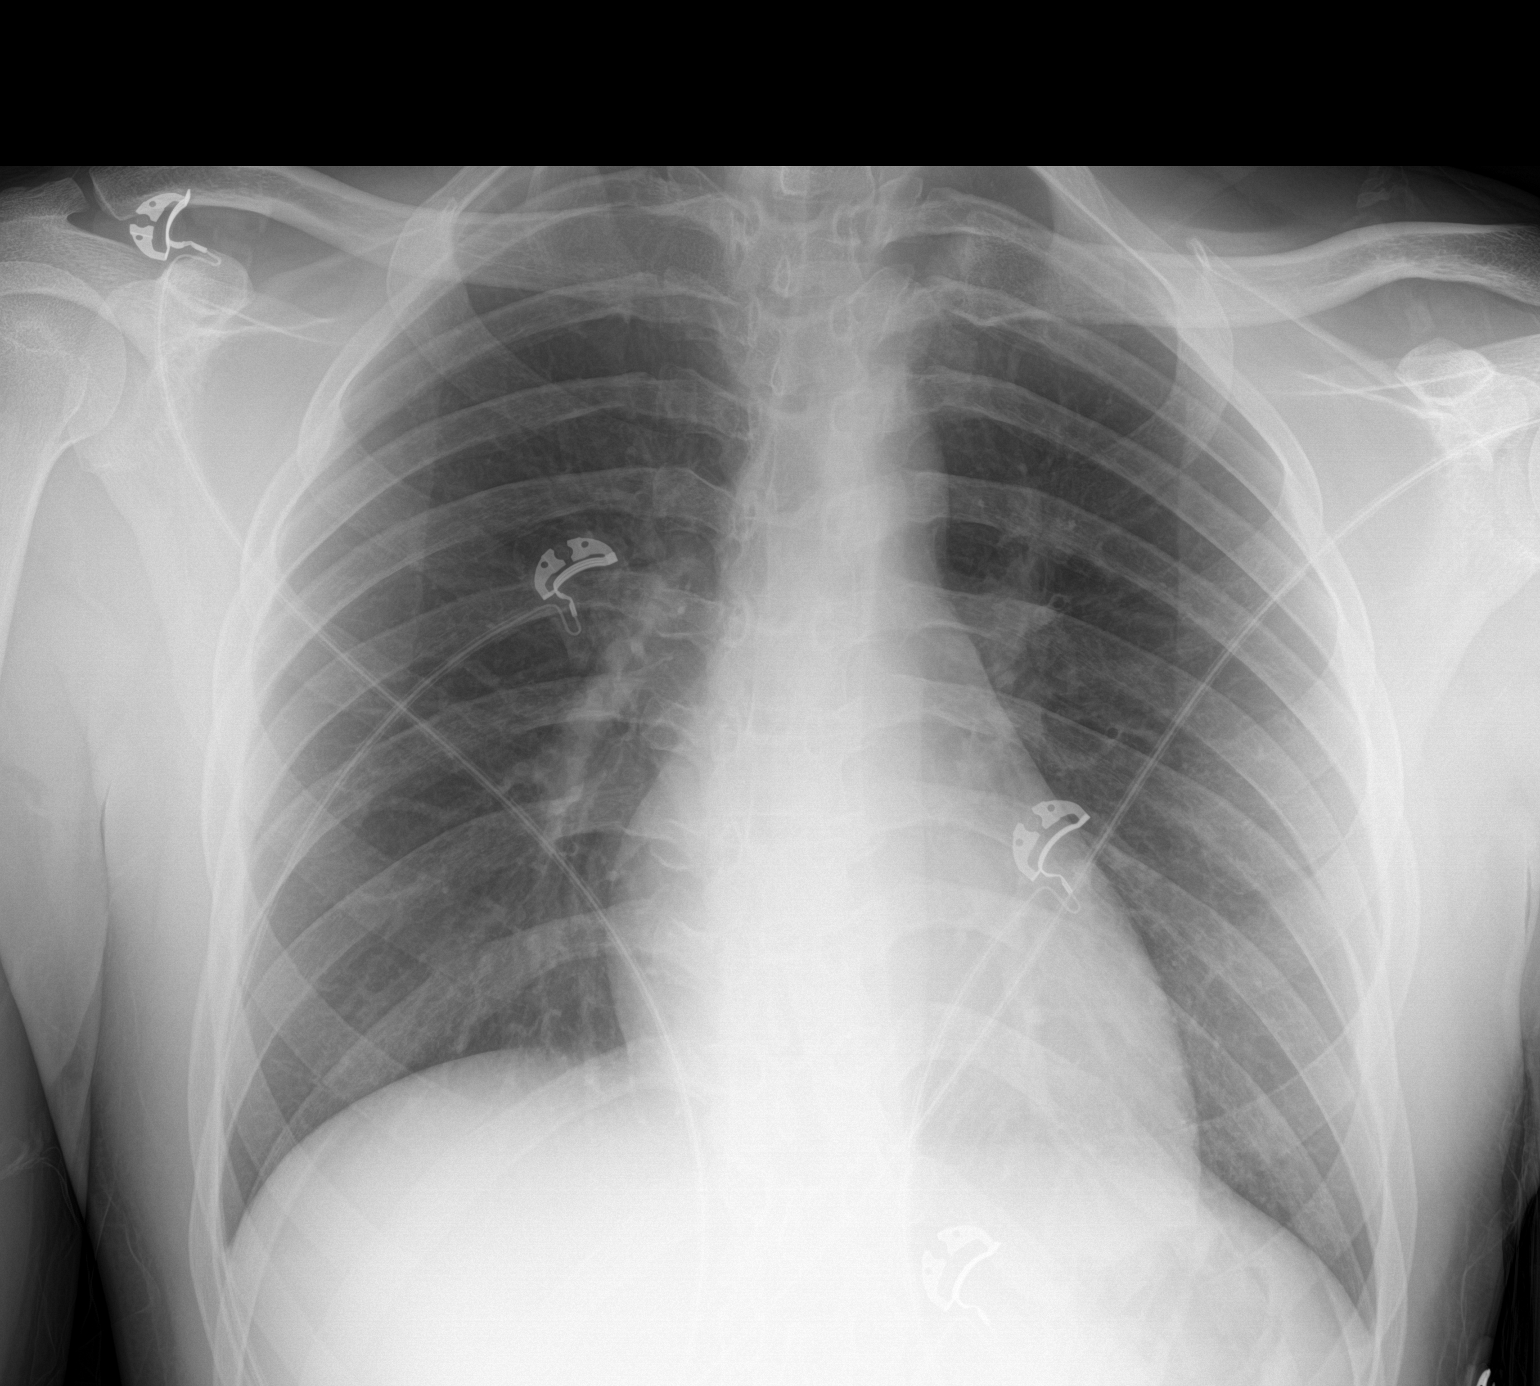

[1 of 1 positions shown; findings below may reference images not displayed]

FINDINGS: The heart size and mediastinal contours are within normal limits.
Both lungs are clear. The visualized skeletal structures are
unremarkable.
IMPRESSION: No active disease.
